# Patient Record
Sex: Male | Born: 1965
Health system: Southern US, Community
[De-identification: ages and names within clinical notes are randomized; demographics above are authoritative.]

## PROBLEM LIST (undated history)

## (undated) DIAGNOSIS — N289 Disorder of kidney and ureter, unspecified: Secondary | ICD-10-CM

## (undated) DIAGNOSIS — L309 Dermatitis, unspecified: Secondary | ICD-10-CM

## (undated) DIAGNOSIS — F79 Unspecified intellectual disabilities: Secondary | ICD-10-CM

## (undated) DIAGNOSIS — I499 Cardiac arrhythmia, unspecified: Secondary | ICD-10-CM

## (undated) DIAGNOSIS — R000 Tachycardia, unspecified: Secondary | ICD-10-CM

## (undated) DIAGNOSIS — I509 Heart failure, unspecified: Secondary | ICD-10-CM

## (undated) HISTORY — PX: NO PAST SURGERIES: SHX2092

## (undated) HISTORY — DX: Cardiac arrhythmia, unspecified: I49.9

## (undated) HISTORY — DX: Unspecified intellectual disabilities: F79

## (undated) HISTORY — DX: Heart failure, unspecified: I50.9

---

## 2010-07-04 ENCOUNTER — Ambulatory Visit: Payer: Self-pay | Admitting: Cardiovascular Disease

## 2011-05-08 ENCOUNTER — Other Ambulatory Visit: Payer: Self-pay | Admitting: *Deleted

## 2011-05-08 MED ORDER — METOPROLOL TARTRATE 25 MG PO TABS: 25.0000 mg | ORAL_TABLET | Freq: Every day | ORAL | Status: DC

## 2011-05-08 NOTE — Telephone Encounter (Signed)
strenghth verified, app made, med refilled

## 2011-06-22 ENCOUNTER — Encounter: Payer: Self-pay | Admitting: Cardiovascular Disease

## 2011-06-28 ENCOUNTER — Ambulatory Visit (INDEPENDENT_AMBULATORY_CARE_PROVIDER_SITE_OTHER): Payer: Medicaid Other | Admitting: Cardiovascular Disease

## 2011-06-28 ENCOUNTER — Encounter: Payer: Self-pay | Admitting: Cardiovascular Disease

## 2011-06-28 VITALS — BP 110/62 | HR 92 | Ht 66.0 in | Wt 121.0 lb

## 2011-06-28 DIAGNOSIS — I4891 Unspecified atrial fibrillation: Secondary | ICD-10-CM | POA: Insufficient documentation

## 2011-06-28 NOTE — Progress Notes (Signed)
Derrick Johns Date of Birth  Jul 14, 1966 [facility] [address]     [address] [place], [place]  [address] [phone]  Fax  [phone]  History of Present Illness:  This is a 45 year old gentleman with a history of mental retardation and chronic atrial fibrillation. I met him in 1997 when he presented to the hospital with congestive heart failure and a rapid heart rate. His son have rapid atrial fibrillation. His congestive heart failure has dramatically improved on medical therapy and rate control.  He has severe mental retardation. We've not had him on Coumadin because of his mental issues.  He presents today with no specific complaints. I see him on a yearly basis.  Current Outpatient Prescriptions on File Prior to Visit  Medication Sig Dispense Refill  . ASPIRIN PO Take by mouth daily.        . digoxin (LANOXIN) 0.125 MG tablet Take 125 mcg by mouth daily.        . metoprolol tartrate (LOPRESSOR) 25 MG tablet Take 1 tablet (25 mg total) by mouth daily.  30 tablet  5  . metoprolol (TOPROL-XL) 50 MG 24 hr tablet Take 50 mg by mouth daily.          No Known Allergies  Past Medical History  Diagnosis Date  . Arrhythmia     atrial fibrillation  . Congestive heart failure     - most likely related to A-Fib with a rapid ventrilar rate  . Mental retardation     No past surgical history on file.  History  Smoking status  . Unknown If Ever Smoked  Smokeless tobacco  . Not on file    History  Alcohol Use     No family history on file.  Reviw of Systems:  Reviewed in the HPI.  All other systems are negative.  Physical Exam: BP 110/62  Pulse 92  Ht 5\' 6"  (1.676 m)  Wt 121 lb (54.885 kg)  BMI 19.53 kg/m2 The patient is alert and oriented x 3.  The mood and affect are normal.   Skin: warm and dry.  Color is normal.    HEENT:   the sclera are nonicteric.  The mucous membranes are moist.  The carotids are 2+ without  bruits.  There is no thyromegaly.  There is no JVD.  All of his teeth have been pulled.  Lungs: clear.  The chest wall is non tender.    Heart: Irregular rate with a normal S1 and S2.  There are no murmurs, gallops, or rubs. The PMI is not displaced.     Abdomen: good bowel sounds.  There is no guarding or rebound.  There is no hepatosplenomegaly or tenderness.  There are no masses.   Extremities:  no clubbing, cyanosis, or edema.  The legs are without rashes.  The distal pulses are intact.   Neuro:  Cranial nerves II - XII are intact.  Motor and sensory functions are intact.    The gait is normal.  ECG: Atrial fibrillation with a controlled ventricular response  Assessment / Plan:

## 2011-06-28 NOTE — Assessment & Plan Note (Addendum)
Derrick Johns to be doing fairly well. His rate is fairly well controlled. We tried to minimize excessive medications. He seems to be fairly weight controlled on his current dose of metoprolol.  We've talked many times about anticoagulation. His left ventricular systolic function is still moderately depressed with an ejection fraction of 35-40%.  Not considered a Coumadin because of his severe mental retardation and general lack of understanding of medications. We talked a little bit about Pradaxa .  t this time I think is doing very well with aspirin.. I'll see him again in one year.  We'll continue to talk about these issues.

## 2011-06-29 ENCOUNTER — Encounter: Payer: Self-pay | Admitting: Cardiovascular Disease

## 2011-07-18 ENCOUNTER — Other Ambulatory Visit: Payer: Self-pay | Admitting: Cardiovascular Disease

## 2011-09-06 ENCOUNTER — Other Ambulatory Visit: Payer: Self-pay | Admitting: Cardiovascular Disease

## 2011-11-07 ENCOUNTER — Other Ambulatory Visit: Payer: Self-pay | Admitting: Cardiovascular Disease

## 2011-11-08 NOTE — Telephone Encounter (Signed)
Fax Received. Refill Completed. Kore Madlock Chowoe (R.M.A)   

## 2012-01-01 ENCOUNTER — Other Ambulatory Visit: Payer: Self-pay | Admitting: Cardiovascular Disease

## 2012-01-16 ENCOUNTER — Other Ambulatory Visit: Payer: Self-pay

## 2012-01-16 ENCOUNTER — Other Ambulatory Visit: Payer: Self-pay | Admitting: Cardiovascular Disease

## 2012-01-16 MED ORDER — DIGOXIN 125 MCG PO TABS: 125.0000 ug | ORAL_TABLET | Freq: Every day | ORAL | Status: DC

## 2012-02-23 ENCOUNTER — Telehealth: Payer: Self-pay | Admitting: Cardiovascular Disease

## 2012-02-23 ENCOUNTER — Other Ambulatory Visit: Payer: Self-pay

## 2012-02-23 ENCOUNTER — Other Ambulatory Visit: Payer: Self-pay | Admitting: *Deleted

## 2012-02-23 MED ORDER — DIGOXIN 125 MCG PO TABS: 125.0000 ug | ORAL_TABLET | Freq: Every day | ORAL | Status: DC

## 2012-02-23 NOTE — Telephone Encounter (Signed)
Digoxin refill sent to [facility] on [address]. Patient's family was called so that she can pick up her mother's medication.

## 2012-02-23 NOTE — Telephone Encounter (Signed)
Pt's mom requesting refill of digoxin asap only has enough until Saturday, pt taking 1 1/2 instead of 1 tab and needs a new rx [facility] [address]

## 2012-02-26 ENCOUNTER — Telehealth: Payer: Self-pay | Admitting: Cardiovascular Disease

## 2012-02-26 MED ORDER — DIGOXIN 125 MCG PO TABS: ORAL_TABLET | ORAL | Status: DC

## 2012-02-26 NOTE — Telephone Encounter (Signed)
Digoxin needs to be called in to [facility] [address], but mom said it was called in wrong last time at 1 tab every day instead if 1 1/2 tabs a day, pls change and needs refill asap ran out early

## 2012-02-26 NOTE — Telephone Encounter (Signed)
Called patient back, but no answer., medication has been refilled

## 2012-05-13 ENCOUNTER — Other Ambulatory Visit: Payer: Self-pay | Admitting: Cardiovascular Disease

## 2012-05-13 NOTE — Telephone Encounter (Signed)
Pt needs appointment then refill can be made Fax Received. Refill Completed. Derrick Johns (R.M.A)   

## 2012-05-22 ENCOUNTER — Telehealth: Payer: Self-pay | Admitting: Cardiovascular Disease

## 2012-05-22 MED ORDER — METOPROLOL TARTRATE 25 MG PO TABS: 25.0000 mg | ORAL_TABLET | Freq: Every day | ORAL | Status: DC

## 2012-05-22 NOTE — Telephone Encounter (Signed)
PT mom told he needs an app/ set/ refill completed

## 2012-05-22 NOTE — Telephone Encounter (Signed)
Please return call to patient mother Derrick Johns, [phone] regarding medication discussion.

## 2012-06-21 ENCOUNTER — Other Ambulatory Visit: Payer: Self-pay | Admitting: Cardiovascular Disease

## 2012-06-21 NOTE — Telephone Encounter (Signed)
Refilled digoxin one time, has appointment in Sept.

## 2012-07-25 ENCOUNTER — Other Ambulatory Visit: Payer: Self-pay | Admitting: Cardiovascular Disease

## 2012-07-25 MED ORDER — DIGOXIN 125 MCG PO TABS: ORAL_TABLET | ORAL | Status: DC

## 2012-07-26 ENCOUNTER — Ambulatory Visit: Payer: Medicare Other | Admitting: Cardiovascular Disease

## 2012-08-08 ENCOUNTER — Encounter: Payer: Self-pay | Admitting: Cardiovascular Disease

## 2012-08-08 ENCOUNTER — Ambulatory Visit (INDEPENDENT_AMBULATORY_CARE_PROVIDER_SITE_OTHER): Payer: Medicare Other | Admitting: Cardiovascular Disease

## 2012-08-08 VITALS — BP 134/84 | HR 77 | Ht 66.0 in | Wt 118.8 lb

## 2012-08-08 DIAGNOSIS — I4891 Unspecified atrial fibrillation: Secondary | ICD-10-CM

## 2012-08-08 NOTE — Patient Instructions (Addendum)
Your physician wants you to follow-up in: 1 year  You will receive a reminder letter in the mail two months in advance. If you don't receive a letter, please call our office to schedule the follow-up appointment.  Your physician recommends that you continue on your current medications as directed. Please refer to the Current Medication list given to you today.  

## 2012-08-09 NOTE — Assessment & Plan Note (Signed)
This is doing well. He remains in atrial fibrillation. He's very comfortable. He does not report any problems.  He's not a candidate for anticoagulation. He has severe mental retardation and probably would not able to tell us if he is having  any problems with abnormal bleeding.  We'll see him again in one year for followup visit.

## 2012-08-09 NOTE — Progress Notes (Signed)
    Derrick Johns Date of Birth  [birth year]       [facility]    [place] [address]  [address] [place], [place]  [address]   [place], [place]  [address] [phone]     [phone]   Fax  [phone]    Fax [phone]  Problem List: 1, Atrial fibrillation 2. Severe mental retardation  History of Present Illness:  This is a 46 year old gentleman who I have seen for many years. He has a history of chronic atrial fibrillation. He has a history of congestive heart failure which presumably was due to a rapid rate when I first met him. He's been well-controlled since I saw him. His rate has been well-controlled. He's been on digoxin.  He's not a candidate for anticoagulation. Both of his parents agree that he may not be able to tell us if he's having any GI bleeding.  Current Outpatient Prescriptions on File Prior to Visit  Medication Sig Dispense Refill  . ASPIRIN PO Take by mouth daily.        . digoxin (LANOXIN) 0.125 MG tablet Take 1 & 1/2 tablets by mouth once daily  25 tablet  0  . lisinopril (PRINIVIL,ZESTRIL) 10 MG tablet take 1 tablet by mouth once daily  30 tablet  PRN  . metoprolol tartrate (LOPRESSOR) 25 MG tablet Take 1 tablet (25 mg total) by mouth daily.  30 tablet  1    No Known Allergies  Past Medical History  Diagnosis Date  . Arrhythmia     atrial fibrillation  . Congestive heart failure     - most likely related to A-Fib with a rapid ventrilar rate  . Mental retardation     No past surgical history on file.  History  Smoking status  . Unknown If Ever Smoked  Smokeless tobacco  . Not on file    History  Alcohol Use     No family history on file.  Reviw of Systems:  Reviewed in the HPI.  All other systems are negative.  Physical Exam: Blood pressure 134/84, pulse 77, height 5\' 6"  (1.676 m), weight 118 lb 12.8 oz (53.887 kg). General: thin  Head: Normocephalic, atraumatic, sclera  non-icteric, mucus membranes are moist,   Neck: Supple. Carotids are 2 + without bruits. No JVD  Lungs: Clear bilaterally to auscultation.  Heart: irregularly irregular.  normal  S1 S2. No murmurs, gallops or rubs.  Abdomen: Soft, non-tender, non-distended with normal bowel sounds. No hepatomegaly. No rebound/guarding. No masses.  Msk:  Strength and tone are normal  Extremities: No clubbing or cyanosis. No edema.  Distal pedal pulses are 2+ and equal bilaterally.  Neuro: Alert and oriented X 3. Moves all extremities spontaneously.  Psych:  Responds to questions appropriately with a normal affect.  ECG: Atrial fibrillation with a heart rate of 77. He has nonspecific ST and T wave changes. Has right axis deviation.  Assessment / Plan:

## 2012-08-12 ENCOUNTER — Other Ambulatory Visit: Payer: Self-pay | Admitting: *Deleted

## 2012-08-12 ENCOUNTER — Other Ambulatory Visit: Payer: Self-pay | Admitting: Cardiovascular Disease

## 2012-08-12 MED ORDER — LISINOPRIL 10 MG PO TABS: 10.0000 mg | ORAL_TABLET | Freq: Every day | ORAL | Status: DC

## 2012-08-12 MED ORDER — DIGOXIN 125 MCG PO TABS: ORAL_TABLET | ORAL | Status: DC

## 2012-08-12 NOTE — Telephone Encounter (Signed)
Fax Received. Refill Completed. Derrick Johns (R.M.A)   

## 2012-08-12 NOTE — Telephone Encounter (Signed)
Refilled Lisinopril. 

## 2012-08-12 NOTE — Telephone Encounter (Signed)
rx sent into pharmacy. Pt mother aware.

## 2012-08-12 NOTE — Telephone Encounter (Signed)
Follow-up:    Patient's mother called in needing a refill of the patient's digoxin (LANOXIN) 0.125 MG tablet tonight.

## 2012-08-16 ENCOUNTER — Encounter: Payer: Self-pay | Admitting: Cardiovascular Disease

## 2012-09-06 ENCOUNTER — Other Ambulatory Visit: Payer: Self-pay | Admitting: Cardiovascular Disease

## 2012-11-13 ENCOUNTER — Telehealth: Payer: Self-pay | Admitting: Cardiovascular Disease

## 2012-11-13 MED ORDER — METOPROLOL TARTRATE 25 MG PO TABS: 25.0000 mg | ORAL_TABLET | Freq: Every day | ORAL | Status: DC

## 2012-11-13 NOTE — Telephone Encounter (Signed)
Fax Received. Refill Completed. Abbee Cremeens Chowoe (R.M.A)   

## 2012-11-13 NOTE — Telephone Encounter (Signed)
Metoprolol 25mg  refill needed [facility] [address] ,  pt out needs called in asap

## 2012-11-14 ENCOUNTER — Other Ambulatory Visit: Payer: Self-pay | Admitting: *Deleted

## 2012-11-14 NOTE — Telephone Encounter (Signed)
Opened in Error.

## 2013-03-11 ENCOUNTER — Other Ambulatory Visit: Payer: Self-pay | Admitting: *Deleted

## 2013-03-11 MED ORDER — LISINOPRIL 10 MG PO TABS: 10.0000 mg | ORAL_TABLET | Freq: Every day | ORAL | Status: DC

## 2013-03-11 NOTE — Telephone Encounter (Signed)
Fax received from pharmacy. Refill completed. Jodette Kashari Chalmers RN  

## 2013-05-14 ENCOUNTER — Other Ambulatory Visit: Payer: Self-pay | Admitting: *Deleted

## 2013-05-14 MED ORDER — METOPROLOL TARTRATE 25 MG PO TABS: 25.0000 mg | ORAL_TABLET | Freq: Every day | ORAL | Status: DC

## 2013-05-14 NOTE — Telephone Encounter (Signed)
Fax Received. Refill Completed. Preeti Winegardner Chowoe (R.M.A)  NEED APPOINTMENT 

## 2013-08-27 ENCOUNTER — Encounter: Payer: Self-pay | Admitting: Cardiovascular Disease

## 2013-08-27 ENCOUNTER — Other Ambulatory Visit: Payer: Self-pay | Admitting: Cardiovascular Disease

## 2013-09-02 ENCOUNTER — Ambulatory Visit (INDEPENDENT_AMBULATORY_CARE_PROVIDER_SITE_OTHER): Payer: Medicare Other | Admitting: Cardiovascular Disease

## 2013-09-02 ENCOUNTER — Encounter: Payer: Self-pay | Admitting: Cardiovascular Disease

## 2013-09-02 VITALS — BP 120/72 | HR 77 | Ht 66.0 in | Wt 112.0 lb

## 2013-09-02 DIAGNOSIS — I4891 Unspecified atrial fibrillation: Secondary | ICD-10-CM

## 2013-09-02 NOTE — Progress Notes (Signed)
     Derrick Johns Date of Birth  1966-06-03       [facility]    [place] [address]  [address] [place], [place]  [address]   [place], [place]  [address] [phone]     [phone]   Fax  [phone]    Fax [phone]  Problem List: 1, Atrial fibrillation 2. Severe mental retardation  History of Present Illness:  This is a 47 year old gentleman who I have seen for many years. He has a history of chronic atrial fibrillation. He has a history of congestive heart failure which presumably was due to a rapid rate when I first met him. He's been well-controlled since I saw him. His rate has been well-controlled. He's been on digoxin.  He's not a candidate for anticoagulation. Both of his parents agree that he may not be able to tell us if he's having any GI bleeding.  Oct. 28, 2014:  Derrick Johns is doing great. No complaints.    Current Outpatient Prescriptions on File Prior to Visit  Medication Sig Dispense Refill  . digoxin (LANOXIN) 0.125 MG tablet Take 1 & 1/2 tablets by mouth once daily  45 tablet  0  . lisinopril (PRINIVIL,ZESTRIL) 10 MG tablet Take 1 tablet (10 mg total) by mouth daily.  90 tablet  3  . metoprolol tartrate (LOPRESSOR) 25 MG tablet Take 1 tablet (25 mg total) by mouth daily.  30 tablet  3   No current facility-administered medications on file prior to visit.    No Known Allergies  Past Medical History  Diagnosis Date  . Arrhythmia     atrial fibrillation  . Congestive heart failure     - most likely related to A-Fib with a rapid ventrilar rate  . Mental retardation     No past surgical history on file.  History  Smoking status  . Never Smoker   Smokeless tobacco  . Not on file    History  Alcohol Use: Not on file    No family history on file.  Reviw of Systems:  Reviewed in the HPI.  All other systems are negative.  Physical Exam: Blood pressure 120/72, pulse 77, height 5\' 6"  (1.676  m), weight 112 lb (50.803 kg). General: thin  Head: Normocephalic, atraumatic, sclera non-icteric, mucus membranes are moist,   Neck: Supple. Carotids are 2 + without bruits. No JVD  Lungs: Clear bilaterally to auscultation.  Heart: irregularly irregular.  normal  S1 S2. No murmurs, gallops or rubs.  Abdomen: Soft, non-tender, non-distended with normal bowel sounds. No hepatomegaly. No rebound/guarding. No masses.  Msk:  Strength and tone are normal  Extremities: No clubbing or cyanosis. No edema.  Distal pedal pulses are 2+ and equal bilaterally.  Neuro: Alert and oriented X 3. Moves all extremities spontaneously.  Psych:  Responds to questions appropriately with a normal affect.  ECG: Oct. 28, 2014: Atrial fibrillation with a heart rate of 77. He has nonspecific ST and T wave changes. Has right axis deviation.  Assessment / Plan:

## 2013-09-02 NOTE — Assessment & Plan Note (Signed)
Derrick Johns is doing well.  He has no complaints.  Will continue current meds. I will see him in 1 year

## 2013-09-02 NOTE — Patient Instructions (Signed)
Your physician recommends that you continue on your current medications as directed. Please refer to the Current Medication list given to you today.  Your physician wants you to follow-up in: 1 year with Dr. Nasher. You will receive a reminder letter in the mail two months in advance. If you don't receive a letter, please call our office to schedule the follow-up appointment.  

## 2013-09-08 ENCOUNTER — Other Ambulatory Visit: Payer: Self-pay | Admitting: *Deleted

## 2013-09-08 MED ORDER — DIGOXIN 125 MCG PO TABS: ORAL_TABLET | ORAL | Status: DC

## 2013-10-06 ENCOUNTER — Other Ambulatory Visit: Payer: Self-pay | Admitting: *Deleted

## 2013-10-06 MED ORDER — METOPROLOL TARTRATE 25 MG PO TABS: 25.0000 mg | ORAL_TABLET | Freq: Every day | ORAL | Status: DC

## 2014-03-13 ENCOUNTER — Other Ambulatory Visit: Payer: Self-pay

## 2014-03-13 MED ORDER — LISINOPRIL 10 MG PO TABS: 10.0000 mg | ORAL_TABLET | Freq: Every day | ORAL | Status: DC

## 2014-06-11 ENCOUNTER — Other Ambulatory Visit: Payer: Self-pay

## 2014-06-11 MED ORDER — LISINOPRIL 10 MG PO TABS: 10.0000 mg | ORAL_TABLET | Freq: Every day | ORAL | Status: DC

## 2014-08-05 ENCOUNTER — Other Ambulatory Visit: Payer: Self-pay | Admitting: *Deleted

## 2014-08-05 MED ORDER — DIGOXIN 125 MCG PO TABS: ORAL_TABLET | ORAL | Status: DC

## 2014-08-25 ENCOUNTER — Other Ambulatory Visit: Payer: Self-pay | Admitting: *Deleted

## 2014-08-25 MED ORDER — METOPROLOL TARTRATE 25 MG PO TABS: 25.0000 mg | ORAL_TABLET | Freq: Every day | ORAL | Status: DC

## 2014-09-06 ENCOUNTER — Other Ambulatory Visit: Payer: Self-pay

## 2014-09-06 MED ORDER — DIGOXIN 125 MCG PO TABS: ORAL_TABLET | ORAL | Status: DC

## 2014-09-10 ENCOUNTER — Other Ambulatory Visit: Payer: Self-pay | Admitting: Cardiovascular Disease

## 2014-09-10 MED ORDER — LISINOPRIL 10 MG PO TABS: 10.0000 mg | ORAL_TABLET | Freq: Every day | ORAL | Status: DC

## 2014-10-06 ENCOUNTER — Other Ambulatory Visit: Payer: Self-pay | Admitting: *Deleted

## 2014-10-06 MED ORDER — DIGOXIN 125 MCG PO TABS: ORAL_TABLET | ORAL | Status: DC

## 2014-10-08 ENCOUNTER — Encounter: Payer: Self-pay | Admitting: Cardiovascular Disease

## 2014-10-08 ENCOUNTER — Ambulatory Visit (INDEPENDENT_AMBULATORY_CARE_PROVIDER_SITE_OTHER): Payer: Medicare Other | Admitting: Cardiovascular Disease

## 2014-10-08 VITALS — BP 110/76 | HR 87 | Ht 66.0 in | Wt 121.0 lb

## 2014-10-08 DIAGNOSIS — I482 Chronic atrial fibrillation, unspecified: Secondary | ICD-10-CM

## 2014-10-08 NOTE — Progress Notes (Signed)
     Derrick Johns Date of Birth  04/11/1966       [place] [facility]    [place] [facility] [address]  [address] [place], [place]  [address]   , [place]  [address] [phone]     [phone]   Fax  [phone]    Fax [phone]  Problem List: 1, Atrial fibrillation 2. Severe mental retardation  History of Present Illness:  This is a 48-year-old gentleman who I have seen for many years. He has a history of chronic atrial fibrillation. He has a history of congestive heart failure which presumably was due to a rapid rate when I first met him. He's been well-controlled since I saw him. His rate has been well-controlled. He's been on digoxin.  He's not a candidate for anticoagulation. Both of his parents agree that he may not be able to tell us if he's having any GI bleeding.  Oct. 28, 2014:  Derrick Johns is doing great. No complaints.   Dec. 3, 2015:  Derrick Johns is doing well .  No complaints. Was seen with his mother.    Current Outpatient Prescriptions on File Prior to Visit  Medication Sig Dispense Refill  . aspirin 81 MG tablet Take 325 mg by mouth daily.     . digoxin (LANOXIN) 0.125 MG tablet Take 1 & 1/2 tablets by mouth once daily 45 tablet 0  . lisinopril (PRINIVIL,ZESTRIL) 10 MG tablet Take 1 tablet (10 mg total) by mouth daily. 90 tablet 0  . metoprolol tartrate (LOPRESSOR) 25 MG tablet Take 1 tablet (25 mg total) by mouth daily. 30 tablet 5   No current facility-administered medications on file prior to visit.    No Known Allergies  Past Medical History  Diagnosis Date  . Arrhythmia     atrial fibrillation  . Congestive heart failure     - most likely related to A-Fib with a rapid ventrilar rate  . Mental retardation     No past surgical history on file.  History  Smoking status  . Never Smoker   Smokeless tobacco  . Not on file    History  Alcohol Use: Not on file    No family history on file.  Reviw of  Systems:  Reviewed in the HPI.  All other systems are negative.  Physical Exam: Blood pressure 110/76, pulse 87, height 5' 6" (1.676 m), weight 121 lb (54.885 kg). General: thin  Head: Normocephalic, atraumatic, sclera non-icteric, mucus membranes are moist,   Neck: Supple. Carotids are 2 + without bruits. No JVD  Lungs: Clear bilaterally to auscultation.  Heart: irregularly irregular.  normal  S1 S2. No murmurs, gallops or rubs.  Abdomen: Soft, non-tender, non-distended with normal bowel sounds. No hepatomegaly. No rebound/guarding. No masses.  Msk:  Strength and tone are normal  Extremities: No clubbing or cyanosis. No edema.  Distal pedal pulses are 2+ and equal bilaterally.  Neuro: Alert and oriented X 3. Moves all extremities spontaneously.  Psych:  Responds to questions appropriately with a normal affect.  ECG: Dec. 3, 2015:   Atrial fibrillation with a heart rate of 87. LBBB.   He has nonspecific ST and T wave changes. Has right axis deviation.  Assessment / Plan:   

## 2014-10-08 NOTE — Patient Instructions (Signed)
Your physician recommends that you continue on your current medications as directed. Please refer to the Current Medication list given to you today.  Your physician wants you to follow-up in: 1 year with Dr. Nahser.  You will receive a reminder letter in the mail two months in advance. If you don't receive a letter, please call our office to schedule the follow-up appointment.  

## 2014-10-08 NOTE — Assessment & Plan Note (Signed)
Derrick Johns continues to be relatively asymptomatic. He's had chronic atrial fibrillation. And severe mental retardation and so we have not pursued anticoagulation. He's not having any symptoms of shortness of breath.  We'll continue with the same medications. His heart rate seems to be well-controlled. I'll see him again in one year.

## 2014-11-04 ENCOUNTER — Other Ambulatory Visit: Payer: Self-pay | Admitting: *Deleted

## 2014-11-04 MED ORDER — DIGOXIN 125 MCG PO TABS: ORAL_TABLET | ORAL | Status: DC

## 2014-12-03 ENCOUNTER — Other Ambulatory Visit: Payer: Self-pay | Admitting: *Deleted

## 2014-12-03 MED ORDER — LISINOPRIL 10 MG PO TABS: 10.0000 mg | ORAL_TABLET | Freq: Every day | ORAL | Status: DC

## 2014-12-18 ENCOUNTER — Telehealth: Payer: Self-pay | Admitting: Nurse Practitioner

## 2014-12-18 NOTE — Telephone Encounter (Signed)
  He has chronic atrial fib for at least 20+ years. He has severe mental retardation and cannot take anticoagulants because of inability to adequately monitor This patient has been stable on this dose for years.   Digoxin 0.25 mg is too high of a dose and Digoxin 0.125 a day does not control his HR adequately. Through years of monitoring , trial and error, we have established that this is the best way to control his HR.

## 2014-12-18 NOTE — Telephone Encounter (Signed)
Received documentation on patient from [employer] that quantity of Digoxin 0.125 mg cannot be filled for more than 30 pills per 30 days.  Patient has been prescribed 1 1/2 tabs daily.  Routing to Dr. Elease HashimotoNahser for documentation for insurance company that patient needs to remain on Digoxin 0.1875 mg daily.

## 2014-12-18 NOTE — Telephone Encounter (Signed)
Paperwork placed in HIM for faxing

## 2014-12-24 ENCOUNTER — Telehealth: Payer: Self-pay | Admitting: Cardiovascular Disease

## 2014-12-24 NOTE — Telephone Encounter (Signed)
New Msg       Pt c/o medication issue:  1. Name of Medication: Digoxin  2. How are you currently taking this medication (dosage and times per day)? 1 1/2 pills daily  3. Are you having a reaction (difficulty breathing--STAT)? no  4. What is your medication issue? Pt insurance is only going to approve 30 day supplies moving forward.   Pt only has 16 pills left.   Please call pt mother Claris CheMargaret about concern.

## 2014-12-24 NOTE — Telephone Encounter (Signed)
I spoke with [employer] [phone]) after faxing paperwork x 2 and then spending 50 minutes on the telephone.  I got approval for patient to receive 45 pills every 30 days, good through 12/24/15 Approval # [id number]  I spoke with patient's mother and advised her of approval.  I advised her to call back if she has any problems or concerns; she verbalized understanding and gratitude.

## 2015-01-28 ENCOUNTER — Emergency Department (HOSPITAL_COMMUNITY)
Admission: EM | Admit: 2015-01-28 | Discharge: 2015-01-28 | Disposition: A | Discharge status: Home / Self Care | Payer: Medicare Other | Attending: Emergency Medicine | Admitting: Emergency Medicine

## 2015-01-28 ENCOUNTER — Emergency Department (HOSPITAL_COMMUNITY): Admission: EM | Admit: 2015-01-28 | Discharge: 2015-01-28 | Discharge status: Absent without leave | Payer: Self-pay

## 2015-01-28 ENCOUNTER — Encounter (HOSPITAL_COMMUNITY): Payer: Self-pay | Admitting: Emergency Medicine

## 2015-01-28 DIAGNOSIS — K5900 Constipation, unspecified: Secondary | ICD-10-CM | POA: Diagnosis not present

## 2015-01-28 DIAGNOSIS — I4891 Unspecified atrial fibrillation: Secondary | ICD-10-CM | POA: Diagnosis not present

## 2015-01-28 DIAGNOSIS — Z87448 Personal history of other diseases of urinary system: Secondary | ICD-10-CM | POA: Diagnosis not present

## 2015-01-28 DIAGNOSIS — Z7982 Long term (current) use of aspirin: Secondary | ICD-10-CM | POA: Insufficient documentation

## 2015-01-28 DIAGNOSIS — I509 Heart failure, unspecified: Secondary | ICD-10-CM | POA: Diagnosis not present

## 2015-01-28 DIAGNOSIS — Z872 Personal history of diseases of the skin and subcutaneous tissue: Secondary | ICD-10-CM | POA: Insufficient documentation

## 2015-01-28 DIAGNOSIS — Z79899 Other long term (current) drug therapy: Secondary | ICD-10-CM | POA: Insufficient documentation

## 2015-01-28 DIAGNOSIS — F79 Unspecified intellectual disabilities: Secondary | ICD-10-CM | POA: Diagnosis not present

## 2015-01-28 HISTORY — DX: Tachycardia, unspecified: R00.0

## 2015-01-28 HISTORY — DX: Dermatitis, unspecified: L30.9

## 2015-01-28 HISTORY — DX: Disorder of kidney and ureter, unspecified: N28.9

## 2015-01-28 MED ORDER — POLYETHYLENE GLYCOL 3350 17 GM/SCOOP PO POWD
1.0000 | Freq: Once | ORAL | Status: DC
  Filled 2015-01-28: qty 255

## 2015-01-28 MED ORDER — BISACODYL 5 MG PO TBEC: 5.0000 mg | DELAYED_RELEASE_TABLET | Freq: Two times a day (BID) | ORAL | Status: DC

## 2015-01-28 NOTE — ED Provider Notes (Signed)
CSN: [id number]     Arrival date & time 01/28/15  1448 History   First MD Initiated Contact with Patient 01/28/15 1620     Chief Complaint  Patient presents with  . Constipation   Level V caveat: MR  (Consider location/radiation/quality/duration/timing/severity/associated sxs/prior Treatment) HPI Derrick Johns is a 49 y.o. male with a history of MR, one kidney (congenital) comes in for evaluation of constipation. He is accompanied by his mother who contributes to the history of present illness. Mom states that patient typically is constipated and will only have a bowel movement once per week on Saturday mornings, but has not had a bowel movement the past 2 Saturdays. Mom has tried magnesium citrate and patient passed "some liquid stool, but nothing formed". They were seen at an urgent care facility and prescribed Linzess. Patient has taken 3 tablets of the Linzess and again passed some liquid stool, but nothing formed. Mom denies any fevers at home, signs of abdominal discomfort. She states patient is eating well, drinking sodas and green tea, denies any nausea or vomiting. No other aggravating or modifying factors.  Past Medical History  Diagnosis Date  . Arrhythmia     atrial fibrillation  . Congestive heart failure     - most likely related to A-Fib with a rapid ventrilar rate  . Mental retardation   . Eczema   . Tachycardia   . Renal disorder     born with only one kidney.   History reviewed. No pertinent past surgical history. History reviewed. No pertinent family history. History  Substance Use Topics  . Smoking status: Never Smoker   . Smokeless tobacco: Not on file  . Alcohol Use: No    Review of Systems  Unable to perform ROS     Allergies  Review of patient's allergies indicates no known allergies.  Home Medications   Prior to Admission medications   Medication Sig Start Date End Date Taking? Authorizing Provider  digoxin (LANOXIN) 0.125 MG tablet Take 187.5  mcg by mouth daily. 12/31/14  Yes Historical Provider, MD  Linaclotide (LINZESS PO) Take 1 tablet by mouth daily.   Yes Historical Provider, MD  lisinopril (PRINIVIL,ZESTRIL) 10 MG tablet Take 10 mg by mouth daily. 12/03/14  Yes Historical Provider, MD  magnesium citrate SOLN Take 1 Bottle by mouth as needed for mild constipation, moderate constipation or severe constipation.   Yes Historical Provider, MD  metoprolol tartrate (LOPRESSOR) 25 MG tablet Take 1 tablet by mouth daily. 01/23/15  Yes Historical Provider, MD  aspirin 81 MG tablet Take 325 mg by mouth daily.     Historical Provider, MD  bisacodyl (DULCOLAX) 5 MG EC tablet Take 1 tablet (5 mg total) by mouth 2 (two) times daily. 01/28/15   Joycie Peek, PA-C  digoxin (LANOXIN) 0.125 MG tablet Take 1 & 1/2 tablets by mouth once daily 11/04/14   Vesta Mixer, MD  lisinopril (PRINIVIL,ZESTRIL) 10 MG tablet Take 1 tablet (10 mg total) by mouth daily. 12/03/14   Vesta Mixer, MD  metoprolol tartrate (LOPRESSOR) 25 MG tablet Take 1 tablet (25 mg total) by mouth daily. 08/25/14   Vesta Mixer, MD   BP 127/94 mmHg  Pulse 50  Temp(Src) 97.5 F (36.4 C) (Axillary)  Resp 18  SpO2 98% Physical Exam  Constitutional: He is oriented to person, place, and time. He appears well-developed and well-nourished. No distress.  HENT:  Head: Normocephalic and atraumatic.  Mouth/Throat: Oropharynx is clear and moist.  Eyes: Conjunctivae are  normal. Pupils are equal, round, and reactive to light. Right eye exhibits no discharge. Left eye exhibits no discharge. No scleral icterus.  Neck: Neck supple.  Cardiovascular: Normal rate, regular rhythm and normal heart sounds.   Pulmonary/Chest: Effort normal and breath sounds normal. No respiratory distress. He has no wheezes. He has no rales.  Abdominal: Soft. There is no tenderness.  Abdomen is firm with very mild distention. Pt does not grimace, withdraw or show any signs of discomfort with palpation of  abdomen. No focal tenderness appreciated. No other lesions, deformities or focal masses noted. No rebound or guarding.  Musculoskeletal: He exhibits no tenderness.  Neurological: He is alert and oriented to person, place, and time.  Cranial Nerves II-XII grossly intact  Skin: Skin is warm and dry. No rash noted. He is not diaphoretic.  Psychiatric: He has a normal mood and affect.  Nursing note and vitals reviewed.   ED Course  Procedures (including critical care time) Labs Review Labs Reviewed - No data to display  Imaging Review No results found.   EKG Interpretation None     Meds given in ED:  Medications - No data to display  Discharge Medication List as of 01/28/2015  5:44 PM    START taking these medications   Details  bisacodyl (DULCOLAX) 5 MG EC tablet Take 1 tablet (5 mg total) by mouth 2 (two) times daily., Starting 01/28/2015, Until Discontinued, Print       Filed Vitals:   01/28/15 1528  BP: 127/94  Pulse: 50  Temp: 97.5 F (36.4 C)  TempSrc: Axillary  Resp: 18  SpO2: 98%    MDM  Vitals stable, afebrile Patient with MR here for evaluation of constipation Tolerating PO in ED Abdominal exam not concerning for acute or emergent pathology, consistent with large stool burden. No Organomegaly.   Pt here with acute exacerbation of chronic constipation. No evidence of Acute Appendicitis, Cholecystitis, Pancreatitis, Pyelonephritis, Cystitis, Diverticulitis, Acute infectious Colitis, SBO, Perforation. No evidence of GI bleed. Doubt torsion. Low suspicion for ischemia or other vascular compromise. Doubt Cardiovascular/Pulmonary etiology  I have personally reviewed all labs, imaging, nursing/prvious notes during the patient's evaluation in the ED today. No evidence of other acute or emergent pathology that requires immediate intervention at this time. Pt stable, in good condition and is appropriate for discharge. DC with osmotic laxative as well as short course  stimulant laxative and instructions to follow up with PCP within 48 hrs for further evaluation and management of symptoms. Also discussed restricting soda intake, increase water intake to assist with facilitation of BM. Discussed return precautions, fevers, emesis, abdominal pain, decreased appetite or food intake, etc. Pt and mother agree with plan.  Prior to patient discharge, I discussed and reviewed this case with Dr.Lockwood    Final diagnoses:  Constipation, unspecified constipation type      Joycie PeekBenjamin Paiton Boultinghouse, PA-C 01/29/15 1710  Gerhard Munchobert Lockwood, MD 01/31/15 2351

## 2015-01-28 NOTE — ED Notes (Signed)
PA at bedside.

## 2015-01-28 NOTE — Discharge Instructions (Signed)
Constipation °Constipation is when a person has fewer than three bowel movements a week, has difficulty having a bowel movement, or has stools that are dry, hard, or larger than normal. As people grow older, constipation is more common. If you try to fix constipation with medicines that make you have a bowel movement (laxatives), the problem may get worse. Long-term laxative use may cause the muscles of the colon to become weak. A low-fiber diet, not taking in enough fluids, and taking certain medicines may make constipation worse.  °CAUSES  °· Certain medicines, such as antidepressants, pain medicine, iron supplements, antacids, and water pills.   °· Certain diseases, such as diabetes, irritable bowel syndrome (IBS), thyroid disease, or depression.   °· Not drinking enough water.   °· Not eating enough fiber-rich foods.   °· Stress or travel.   °· Lack of physical activity or exercise.   °· Ignoring the urge to have a bowel movement.   °· Using laxatives too much.   °SIGNS AND SYMPTOMS  °· Having fewer than three bowel movements a week.   °· Straining to have a bowel movement.   °· Having stools that are hard, dry, or larger than normal.   °· Feeling full or bloated.   °· Pain in the lower abdomen.   °· Not feeling relief after having a bowel movement.   °DIAGNOSIS  °Your health care provider will take a medical history and perform a physical exam. Further testing may be done for severe constipation. Some tests may include: °· A barium enema X-ray to examine your rectum, colon, and, sometimes, your small intestine.   °· A sigmoidoscopy to examine your lower colon.   °· A colonoscopy to examine your entire colon. °TREATMENT  °Treatment will depend on the severity of your constipation and what is causing it. Some dietary treatments include drinking more fluids and eating more fiber-rich foods. Lifestyle treatments may include regular exercise. If these diet and lifestyle recommendations do not help, your health care  provider may recommend taking over-the-counter laxative medicines to help you have bowel movements. Prescription medicines may be prescribed if over-the-counter medicines do not work.  °HOME CARE INSTRUCTIONS  °· Eat foods that have a lot of fiber, such as fruits, vegetables, whole grains, and beans. °· Limit foods high in fat and processed sugars, such as french fries, hamburgers, cookies, candies, and soda.   °· A fiber supplement may be added to your diet if you cannot get enough fiber from foods.   °· Drink enough fluids to keep your urine clear or pale yellow.   °· Exercise regularly or as directed by your health care provider.   °· Go to the restroom when you have the urge to go. Do not hold it.   °· Only take over-the-counter or prescription medicines as directed by your health care provider. Do not take other medicines for constipation without talking to your health care provider first.   °SEEK IMMEDIATE MEDICAL CARE IF:  °· You have bright red blood in your stool.   °· Your constipation lasts for more than 4 days or gets worse.   °· You have abdominal or rectal pain.   °· You have thin, pencil-like stools.   °· You have unexplained weight loss. °MAKE SURE YOU:  °· Understand these instructions. °· Will watch your condition. °· Will get help right away if you are not doing well or get worse. °Document Released: 07/21/2004 Document Revised: 10/28/2013 Document Reviewed: 08/04/2013 °[employer]® Patient Information ©2015 [employer]. This information is not intended to replace advice given to you by your health care provider. Make sure you discuss any questions   you have with your health care provider.  You were evaluated in the ED today for your constipation. There does not appear to be an emergent cause for your symptoms at this time. Please take your medications as directed. Please follow-up with her primary care for further evaluation and management of your symptoms. Return to ED for new or worsening  symptoms, fevers, chills, nausea or vomiting, decreased appetite, abdominal pain.

## 2015-01-28 NOTE — ED Notes (Signed)
Pt c/o constipation x 3 weeks, tried citrate of magnesia without success. Pt states constipation is a chronic problem but hasn't had BM in 3 weeks. Denies pain.

## 2015-01-28 NOTE — ED Notes (Signed)
Pt very anxious, pacing the hallway bc he is ready to leave.

## 2015-01-29 ENCOUNTER — Encounter: Payer: Self-pay | Admitting: Cardiovascular Disease

## 2015-02-04 ENCOUNTER — Emergency Department (HOSPITAL_COMMUNITY): Payer: Medicare Other

## 2015-02-04 ENCOUNTER — Encounter (HOSPITAL_COMMUNITY): Payer: Self-pay | Admitting: *Deleted

## 2015-02-04 ENCOUNTER — Emergency Department (HOSPITAL_COMMUNITY)
Admission: EM | Admit: 2015-02-04 | Discharge: 2015-02-04 | Disposition: A | Discharge status: Home / Self Care | Payer: Medicare Other | Attending: Emergency Medicine | Admitting: Emergency Medicine

## 2015-02-04 DIAGNOSIS — Z79899 Other long term (current) drug therapy: Secondary | ICD-10-CM | POA: Diagnosis not present

## 2015-02-04 DIAGNOSIS — Z87448 Personal history of other diseases of urinary system: Secondary | ICD-10-CM | POA: Insufficient documentation

## 2015-02-04 DIAGNOSIS — Z7982 Long term (current) use of aspirin: Secondary | ICD-10-CM | POA: Diagnosis not present

## 2015-02-04 DIAGNOSIS — K5900 Constipation, unspecified: Secondary | ICD-10-CM | POA: Insufficient documentation

## 2015-02-04 DIAGNOSIS — F99 Mental disorder, not otherwise specified: Secondary | ICD-10-CM | POA: Diagnosis not present

## 2015-02-04 DIAGNOSIS — Z872 Personal history of diseases of the skin and subcutaneous tissue: Secondary | ICD-10-CM | POA: Insufficient documentation

## 2015-02-04 DIAGNOSIS — I509 Heart failure, unspecified: Secondary | ICD-10-CM | POA: Diagnosis not present

## 2015-02-04 MED ORDER — POLYETHYLENE GLYCOL 3350 17 G PO PACK
17.0000 g | PACK | Freq: Every day | ORAL | Status: AC
Start: 2015-02-04 — End: ?

## 2015-02-04 NOTE — ED Provider Notes (Signed)
CSN: [id number]     Arrival date & time 02/04/15  1419 History   First MD Initiated Contact with Patient 02/04/15 1508     Chief Complaint  Patient presents with  . Constipation     (Consider location/radiation/quality/duration/timing/severity/associated sxs/prior Treatment) HPI Comments: Derrick Johns is a 49 y.o. male with a PMHx of mental retardation, congenital solitary kidney, and paroxsymal afib, who presents to the ED with his mother who gives most of the history, with complaints of constipation. LEVEL 5 CAVEAT: MENTAL RETARDATION. The patient's mother reports that he typically has 1 formed bowel movement per week, but that he has only had 1 bowel movement in the last 4 weeks which was last Thursday after he was given PEG solution subsequent to an ER visit. The bowel movement that day was "normal appearing" and soft. Patient's mother reports that he has been taking 2 tablets of Dulcolax daily since Saturday with no bowel movements. She reports that he has been eating and drinking well and his urine output has been the same. The patient denies any abdominal pain, nausea, dysuria, chest pain, or shortness of breath. The mother denies any fevers/chills, vomiting, melena, hematochezia, or hematuria.  Patient is a 49 y.o. male presenting with constipation. The history is provided by a parent. The history is limited by a developmental delay. No language interpreter was used.  Constipation Severity:  Moderate Time since last bowel movement:  1 week Timing:  Constant Progression:  Unchanged Chronicity:  Chronic Context: not narcotics   Stool description:  Formed Unusual stool frequency:  1x/week Relieved by:  Nothing Worsened by:  Nothing tried Ineffective treatments:  Laxatives and stool softeners Associated symptoms: no abdominal pain, no diarrhea, no dysuria, no fever, no flatus, no hematochezia, no nausea, no urinary retention and no vomiting     Past Medical History  Diagnosis  Date  . Arrhythmia     atrial fibrillation  . Congestive heart failure     - most likely related to A-Fib with a rapid ventrilar rate  . Mental retardation   . Eczema   . Tachycardia   . Renal disorder     born with only one kidney.   History reviewed. No pertinent past surgical history. History reviewed. No pertinent family history. History  Substance Use Topics  . Smoking status: Never Smoker   . Smokeless tobacco: Not on file  . Alcohol Use: No     LEVEL 5 CAVEAT: MENTAL RETARDATION Review of Systems  Unable to perform ROS: Other  Constitutional: Negative for fever and chills.  HENT: Negative for sore throat.   Respiratory: Negative for shortness of breath.   Cardiovascular: Negative for chest pain.  Gastrointestinal: Positive for constipation. Negative for nausea, vomiting, abdominal pain, diarrhea, blood in stool, hematochezia, anal bleeding and flatus.  Genitourinary: Negative for dysuria and hematuria.  Skin: Negative for rash.  Psychiatric/Behavioral: Negative for behavioral problems.      Allergies  Review of patient's allergies indicates no known allergies.  Home Medications   Prior to Admission medications   Medication Sig Start Date End Date Taking? Authorizing Provider  aspirin 325 MG tablet Take 325 mg by mouth daily.   Yes Historical Provider, MD  bisacodyl (DULCOLAX) 5 MG EC tablet Take 1 tablet (5 mg total) by mouth 2 (two) times daily. 01/28/15  Yes Joycie Peek, PA-C  digoxin (LANOXIN) 0.125 MG tablet Take 1 & 1/2 tablets by mouth once daily 11/04/14  Yes Vesta Mixer, MD  digoxin (LANOXIN) 0.125  MG tablet Take 187.5 mcg by mouth every evening.  12/31/14  Yes Historical Provider, MD  Linaclotide (LINZESS PO) Take 1 tablet by mouth daily.   Yes Historical Provider, MD  lisinopril (PRINIVIL,ZESTRIL) 10 MG tablet Take 1 tablet (10 mg total) by mouth daily. 12/03/14  Yes Vesta MixerPhilip J Nahser, MD  lisinopril (PRINIVIL,ZESTRIL) 10 MG tablet Take 10 mg by  mouth daily. 12/03/14  Yes Historical Provider, MD  magnesium citrate SOLN Take 1 Bottle by mouth as needed for mild constipation (constipation).    Yes Historical Provider, MD  metoprolol tartrate (LOPRESSOR) 25 MG tablet Take 1 tablet (25 mg total) by mouth daily. 08/25/14  Yes Vesta MixerPhilip J Nahser, MD  metoprolol tartrate (LOPRESSOR) 25 MG tablet Take 1 tablet by mouth daily. 01/23/15  Yes Historical Provider, MD   BP 123/78 mmHg  Pulse 94  Temp(Src) 97.5 F (36.4 C) (Axillary)  Resp 18  SpO2 99% Physical Exam  Constitutional: Vital signs are normal. He appears well-developed and well-nourished.  Non-toxic appearance. No distress.  Afebrile, nontoxic, NAD  HENT:  Head: Normocephalic and atraumatic.  Mouth/Throat: Oropharynx is clear and moist and mucous membranes are normal.  Eyes: Conjunctivae and EOM are normal. Right eye exhibits no discharge. Left eye exhibits no discharge.  Neck: Normal range of motion. Neck supple.  Cardiovascular: Normal rate, regular rhythm, normal heart sounds and intact distal pulses.  Exam reveals no gallop and no friction rub.   No murmur heard. Pulmonary/Chest: Effort normal and breath sounds normal. No respiratory distress. He has no decreased breath sounds. He has no wheezes. He has no rhonchi. He has no rales.  Abdominal: Soft. Normal appearance and bowel sounds are normal. He exhibits no distension. There is no tenderness. There is no rigidity, no rebound, no guarding, no tenderness at McBurney's point and negative Murphy's sign.  Soft, NTND, +BS throughout, no r/g/r, neg murphy's, neg mcburney's point tenderness  Genitourinary: Rectum normal and prostate normal.  Chaperone present No gross blood noted on rectal exam, normal tone, no tenderness, no mass or fissure, no hemorrhoids. No fecal impaction. Prostate without tenderness or enlargement  Musculoskeletal: Normal range of motion.  Neurological: He is alert. He has normal strength. No sensory deficit.   Skin: Skin is warm, dry and intact. No rash noted.  Psychiatric: He has a normal mood and affect.  Nursing note and vitals reviewed.   ED Course  Procedures (including critical care time) Labs Review Labs Reviewed - No data to display  Imaging Review Dg Abd Acute W/chest  02/04/2015   CLINICAL DATA:  Constipation.  Mental retardation.  EXAM: ACUTE ABDOMEN SERIES (ABDOMEN 2 VIEW & CHEST 1 VIEW)  COMPARISON:  None.  FINDINGS: Cardiomegaly. Slight vascular congestion. No infiltrates or failure.  Normal bowel gas pattern without obstruction or free air. Unremarkable stool burden. No abnormal calcifications or worrisome osseous findings.  IMPRESSION: Negative abdominal radiographs.  No acute cardiopulmonary disease.   Electronically Signed   By: Davonna BellingJohn  Curnes M.D.   On: 02/04/2015 16:05     EKG Interpretation None      MDM   Final diagnoses:  Constipation    49 y.o. male here with chronic constipation, seen last week for same and sent home with PEG and dulcolax, had one BM last week but none since. Mother is concerned for fecal impaction. On exam, no feces in rectum, abdomen is soft and nontender. Will obtain acute abd series to evaluate for stool burden or obstruction although pt tolerating PO well and  doubt obstruction as etiology. Will likely need to f/up with gastroenterology for ongoing management. Would likely benefit from fiber and miralax daily.   4:50 PM Xray reveals no stool burden or obstruction. Will have him start miralax daily. Increase fiber and water intake discussed. Will have him f/up with PCP who has referred him to GI already. I explained the diagnosis and have given explicit precautions to return to the ER including for any other new or worsening symptoms. The pt's mother understands and accepts the medical plan as it's been dictated and I have answered their questions. Discharge instructions concerning home care and prescriptions have been given. The patient is STABLE  and is discharged to home in good condition.  BP 123/78 mmHg  Pulse 94  Temp(Src) 97.5 F (36.4 C) (Axillary)  Resp 18  SpO2 99%  Meds ordered this encounter  Medications  . polyethylene glycol (MIRALAX / GLYCOLAX) packet    Sig: Take 17 g by mouth daily.    Dispense:  14 each    Refill:  0    Order Specific Question:  Supervising Provider    Answer:  Eber Hong [[id number]]     Pebbles Zeiders Camprubi-Soms, PA-C 02/04/15 1651  Mancel Bale, MD 02/05/15 [phone]

## 2015-02-04 NOTE — ED Notes (Signed)
Pt refused d/c vitals.

## 2015-02-04 NOTE — ED Notes (Signed)
Pt will not allow anyone to take pt's temperature.

## 2015-02-04 NOTE — ED Notes (Addendum)
Patient has only had one bowel movement in  4 weeks and that was following a ED visit where he was given gatorade. He has been taking the dulcolax tablets, 2 daily since the bowel movement on Thursday and has had no results. Patient denies abdominal pain and according to his mother he is eating well. Patient is mentally retarded and will not allow mother to do any form of enema or suppository.

## 2015-02-04 NOTE — ED Notes (Signed)
Per mother, pt has not had bowel movement since last Thursday when he was seen here in ED. Mother sts that pt's norm is once a week. Pt has feces stains in his underwear. Denies pain, Abdomen soft, nontender.

## 2015-02-04 NOTE — Discharge Instructions (Signed)
Use miralax daily. Drink plenty of water and increase fiber intake in your diet. See your regular doctor in 2-3 days for ongoing care and referral to gastroenterologist. Return to the ER for changes or worsening symptoms.   Constipation Constipation is when a person:  Poops (has a bowel movement) less than 3 times a week.  Has a hard time pooping.  Has poop that is dry, hard, or bigger than normal. HOME CARE   Eat foods with a lot of fiber in them. This includes fruits, vegetables, beans, and whole grains such as brown rice.  Avoid fatty foods and foods with a lot of sugar. This includes french fries, hamburgers, cookies, candy, and soda.  If you are not getting enough fiber from food, take products with added fiber in them (supplements).  Drink enough fluid to keep your pee (urine) clear or pale yellow.  Exercise on a regular basis, or as told by your doctor.  Go to the restroom when you feel like you need to poop. Do not hold it.  Only take medicine as told by your doctor. Do not take medicines that help you poop (laxatives) without talking to your doctor first. GET HELP RIGHT AWAY IF:   You have bright red blood in your poop (stool).  Your constipation lasts more than 4 days or gets worse.  You have belly (abdominal) or butt (rectal) pain.  You have thin poop (as thin as a pencil).  You lose weight, and it cannot be explained. MAKE SURE YOU:   Understand these instructions.  Will watch your condition.  Will get help right away if you are not doing well or get worse. Document Released: 04/10/2008 Document Revised: 10/28/2013 Document Reviewed: 08/04/2013 [facility] Patient Information 2015 [employer] [place], [place]. This information is not intended to replace advice given to you by your health care provider. Make sure you discuss any questions you have with your health care provider.

## 2015-02-18 ENCOUNTER — Other Ambulatory Visit: Payer: Self-pay

## 2015-02-18 MED ORDER — METOPROLOL TARTRATE 25 MG PO TABS: 25.0000 mg | ORAL_TABLET | Freq: Every day | ORAL | Status: DC

## 2015-04-20 ENCOUNTER — Other Ambulatory Visit: Payer: Self-pay | Admitting: Cardiovascular Disease

## 2015-04-20 MED ORDER — DIGOXIN 125 MCG PO TABS: ORAL_TABLET | ORAL | Status: DC

## 2015-08-17 ENCOUNTER — Encounter: Payer: Self-pay | Admitting: Podiatry

## 2015-08-17 ENCOUNTER — Ambulatory Visit (INDEPENDENT_AMBULATORY_CARE_PROVIDER_SITE_OTHER): Payer: Medicare Other | Admitting: Podiatry

## 2015-08-17 VITALS — BP 146/72 | HR 60 | Resp 12

## 2015-08-17 DIAGNOSIS — M79676 Pain in unspecified toe(s): Secondary | ICD-10-CM | POA: Diagnosis not present

## 2015-08-17 DIAGNOSIS — B351 Tinea unguium: Secondary | ICD-10-CM

## 2015-08-17 NOTE — Progress Notes (Signed)
   Subjective:    Patient ID: Derrick Johns, male    DOB: Jun 27, 1966, 49 y.o.   MRN: [id number]  HPI    This patient presents today with his mother present in the treatment when speaking for her son says that in the past 6 months his toenails at become progressively thicker, longer and more difficult to cut. She now says her son has some complaint when he wears his shoes. Parents have attempted to trim toenails, however, were not successful at the request nail debridement  Patient has a history of mental retardation lives at home with his parents  Review of Systems  HENT: Positive for hearing loss.   Skin: Positive for rash.       Objective:   Physical Exam Patient presents with mother treatment room with limited verbal skills. He yells and space inappropriately  Vascular: DP and PT pulses 2/4 bilaterally Capillary reflex immediate bilaterally  Neurological: Ankle reflex equal and reactive bilaterally Patient not able respond to tuning fork or 10 g monofilament wire  Dermatological: The toenails are extremely elongated, brittle, incurvated, hypertrophic, deformed and tender to direct palpation 6-10  Musculoskeletal: Pes planus bilaterally There is no restriction ankle, subtalar or midtarsal joints bilaterally       Assessment & Plan:   Assessment: Neglected symptomatic onychomycoses 6-10  Plan: Debridement toenails 10 mechanically and electrically without any bleeding  Reappoint at the request of patient's mother or at three-month intervals

## 2015-09-20 ENCOUNTER — Other Ambulatory Visit: Payer: Self-pay | Admitting: *Deleted

## 2015-09-20 MED ORDER — METOPROLOL TARTRATE 25 MG PO TABS
25.0000 mg | ORAL_TABLET | Freq: Every day | ORAL | Status: DC
Start: 2015-09-20 — End: 2015-10-20

## 2015-10-12 ENCOUNTER — Encounter: Payer: Self-pay | Admitting: Cardiovascular Disease

## 2015-10-12 ENCOUNTER — Ambulatory Visit (INDEPENDENT_AMBULATORY_CARE_PROVIDER_SITE_OTHER): Payer: Medicare Other | Admitting: Cardiovascular Disease

## 2015-10-12 VITALS — BP 130/82 | HR 65 | Ht 66.0 in | Wt 121.0 lb

## 2015-10-12 DIAGNOSIS — I482 Chronic atrial fibrillation, unspecified: Secondary | ICD-10-CM

## 2015-10-12 NOTE — Patient Instructions (Addendum)

## 2015-10-12 NOTE — Progress Notes (Signed)
Derrick Johns Date of Birth  October 15, 1966       [facility]    [employer] [address], [address], [place], [place]  [address]   [place], [place]  [address] [phone]     [phone]   Fax  [phone]    Fax [phone]  Problem List: 1, Atrial fibrillation 2. Severe mental retardation  History of Present Illness:  This is a 49 year old gentleman who I have seen for many years. He has a history of chronic atrial fibrillation. He has a history of congestive heart failure which presumably was due to a rapid rate when I first met him. He's been well-controlled since I saw him. His rate has been well-controlled. He's been on digoxin.  He's not a candidate for anticoagulation. Both of his parents agree that he may not be able to tell us if he's having any GI bleeding.  Oct. 28, 2014:  Derrick Johns is doing great. No complaints.   Dec. 3, 2015:  Derrick Johns is doing well .  No complaints. Was seen with his mother.   Dec. 6 , 2016:  Doing well. No CP or dyspnea  No longer working .    Current Outpatient Prescriptions on File Prior to Visit  Medication Sig Dispense Refill  . aspirin 325 MG tablet Take 325 mg by mouth daily.    . digoxin (LANOXIN) 0.125 MG tablet Take 1 & 1/2 tablets by mouth once daily 45 tablet 5  . lisinopril (PRINIVIL,ZESTRIL) 10 MG tablet Take 10 mg by mouth daily.  0  . metoprolol tartrate (LOPRESSOR) 25 MG tablet Take 1 tablet (25 mg total) by mouth daily. 30 tablet 0  . polyethylene glycol (MIRALAX / GLYCOLAX) packet Take 17 g by mouth daily. 14 each 0   No current facility-administered medications on file prior to visit.    No Known Allergies  Past Medical History  Diagnosis Date  . Arrhythmia     atrial fibrillation  . Congestive heart failure ([place])     - most likely related to A-Fib with a rapid ventrilar rate  . Mental retardation   . Eczema   . Tachycardia   . Renal disorder     born with only  one kidney.    Past Surgical History  Procedure Laterality Date  . No past surgeries      History  Smoking status  . Never Smoker   Smokeless tobacco  . Never Used    History  Alcohol Use No    No family history on file.  Reviw of Systems:  Reviewed in the HPI.  All other systems are negative.  Physical Exam: Blood pressure 130/82, pulse 65, height $RemoveBe'5\' 6"'mLjtqnacY$  (1.676 m), weight 121 lb (54.885 kg). General: thin,  Mentally retarded man.    Head: Normocephalic, atraumatic, sclera non-icteric, mucus membranes are moist,   Neck: Supple. Carotids are 2 + without bruits. No JVD  Lungs: Clear bilaterally to auscultation.  Heart: irregularly irregular.  normal  S1 S2. No murmurs, gallops or rubs.  Abdomen: Soft, non-tender, non-distended with normal bowel sounds. No hepatomegaly. No rebound/guarding. No masses.  Msk:  Strength and tone are normal  Extremities: No clubbing or cyanosis. No edema.  Distal pedal pulses are 2+ and equal bilaterally.  Neuro: Alert and oriented X 3. Moves all extremities spontaneously.  Psych:  Responds to questions appropriately with a normal affect.  ECG: Dec. 6, 2016:  Atrial fib with rate of  65.   NS ST abn.   No changes from previous tracing   Assessment / Plan:   1. A. fib fibrillation: This is doing very well. His rate is very well-controlled. Continue with current medications. I'll see him again in one year. 2. Mental retardation :  Remains a challenge.  Follow up with his medical doctor .    Jamisha Hoeschen, Wonda Cheng, MD  10/12/2015 11:55 AM    [address],  [place], [place]  [address] Pager [phone] Phone: [phone]; Fax: [phone]   [facility]  [address] [place], [place]  [address] [phone]   Fax [phone]

## 2015-10-19 ENCOUNTER — Other Ambulatory Visit: Payer: Self-pay

## 2015-10-19 MED ORDER — DIGOXIN 125 MCG PO TABS: ORAL_TABLET | ORAL | Status: DC

## 2015-10-19 NOTE — Telephone Encounter (Signed)
Vesta MixerPhilip J Nahser, MD at 10/12/2015 11:50 AM  digoxin (LANOXIN) 0.125 MG tabletTake 1 & 1/2 tablets by mouth once daily Patient Instructions     Medication Instructions:  Your physician recommends that you continue on your current medications as directed. Please refer to the Current Medication list given to you today.

## 2015-10-20 ENCOUNTER — Other Ambulatory Visit: Payer: Self-pay | Admitting: *Deleted

## 2015-10-20 MED ORDER — METOPROLOL TARTRATE 25 MG PO TABS: 25.0000 mg | ORAL_TABLET | Freq: Every day | ORAL | Status: DC

## 2015-12-02 ENCOUNTER — Other Ambulatory Visit: Payer: Self-pay

## 2015-12-02 MED ORDER — LISINOPRIL 10 MG PO TABS: 10.0000 mg | ORAL_TABLET | Freq: Every day | ORAL | Status: DC

## 2016-01-24 ENCOUNTER — Telehealth: Payer: Self-pay | Admitting: Cardiovascular Disease

## 2016-01-24 NOTE — Telephone Encounter (Signed)
New message      Pt is on digioxin 0.125mg .  He takes 1.5 tablets daily.  Ins will only pay for 30 tablets a month but he takes 45 tablets.  Need prior auth to get 45 tab monthly.  Please call

## 2016-01-25 NOTE — Telephone Encounter (Signed)
Spoke with patient's mother. Advised her I needed to know his pharmacy beni co. I will try to get through to [facility] for this info.

## 2016-02-15 ENCOUNTER — Telehealth: Payer: Self-pay

## 2016-02-15 NOTE — Telephone Encounter (Signed)
Prior auth for Digoxin 0.125mg  sent to [employer].

## 2016-02-17 ENCOUNTER — Other Ambulatory Visit: Payer: Self-pay | Admitting: *Deleted

## 2016-02-17 MED ORDER — DIGOXIN 125 MCG PO TABS: ORAL_TABLET | ORAL | Status: DC

## 2016-02-22 ENCOUNTER — Telehealth: Payer: Self-pay

## 2016-02-22 ENCOUNTER — Ambulatory Visit: Payer: Medicare Other | Admitting: Cardiovascular Disease

## 2016-02-22 NOTE — Telephone Encounter (Signed)
Digoxin approved for 1 year. Case ID # [id number].

## 2016-02-29 ENCOUNTER — Ambulatory Visit: Payer: Medicare Other | Admitting: Cardiovascular Disease

## 2016-09-13 ENCOUNTER — Other Ambulatory Visit: Payer: Self-pay | Admitting: Family Medicine

## 2016-09-13 ENCOUNTER — Ambulatory Visit
Admission: RE | Admit: 2016-09-13 | Discharge: 2016-09-13 | Disposition: A | Discharge status: Home / Self Care | Payer: Medicare Other | Source: Ambulatory Visit | Attending: Family Medicine | Admitting: Family Medicine

## 2016-09-13 DIAGNOSIS — M25511 Pain in right shoulder: Secondary | ICD-10-CM

## 2016-09-20 ENCOUNTER — Ambulatory Visit: Payer: Medicare Other | Attending: Family Medicine

## 2016-09-20 DIAGNOSIS — M25511 Pain in right shoulder: Secondary | ICD-10-CM | POA: Insufficient documentation

## 2016-09-20 DIAGNOSIS — G8929 Other chronic pain: Secondary | ICD-10-CM

## 2016-09-20 NOTE — Therapy (Signed)
[facility] [address] [place], [place], [address] Phone: [phone]   Fax:  [phone]  Physical Therapy Evaluation  Patient Details  Name: Derrick Johns MRN: [id number] Date of Birth: [birth year] Referring Provider: Darrow BussingKoirala, Dibas, MD  Encounter Date: 09/20/2016      PT End of Session - 09/20/16 1126    Visit Number 1   PT Start Time 1102   PT Stop Time 1126  therapy not indicated for this patient   PT Time Calculation (min) 24 min   Activity Tolerance Patient tolerated treatment well   Behavior During Therapy Restless;Anxious      Past Medical History:  Diagnosis Date  . Arrhythmia    atrial fibrillation  . Congestive heart failure (HCC)    - most likely related to A-Fib with a rapid ventrilar rate  . Eczema   . Mental retardation   . Renal disorder    born with only one kidney.  . Tachycardia     Past Surgical History:  Procedure Laterality Date  . NO PAST SURGERIES      There were no vitals filed for this visit.       Subjective Assessment - 09/20/16 1107    Subjective Pt presents to PT with his mother with complaints of Rt shoulder pain that began 09/10/16.  Pt was getting dressed and couldn't get his shirt on due to pain.  No injury reported.  Pt had 3-4 days where he couldn't use the arm.  Pain has now resolved and he has returned to regular function including bowling.  X-ray was negative.     Patient is accompained by: Family member   Pertinent History significant cognitive delay   Diagnostic tests x-ray: negative   Patient Stated Goals learn exercise for Rt shoulder.  No longer having pain so doesn't feel like he needs PT   Currently in Pain? No/denies            [facility] PT Assessment - 09/20/16 0001      Assessment   Medical Diagnosis Rt anterior shoulder pain   Referring Provider Koirala, Dibas, MD   Onset Date/Surgical Date 09/10/16   Hand Dominance Right   Next MD  Visit none   Prior Therapy none     Precautions   Precautions Other (comment)  cognitive delay     Restrictions   Weight Bearing Restrictions No     Balance Screen   Has the patient fallen in the past 6 months No   Has the patient had a decrease in activity level because of a fear of falling?  No   Is the patient reluctant to leave their home because of a fear of falling?  No     Home Environment   Living Environment Private residence   Living Arrangements Parent   Type of Home House     Prior Function   Level of Independence Independent   Vocation On disability   Leisure bowling     Cognition   Overall Cognitive Status History of cognitive impairments - at baseline   Behaviors Restless;Physical agitation     Observation/Other Assessments   Focus on Therapeutic Outcomes (FOTO)  84% limitation     Posture/Postural Control   Posture/Postural Control Postural limitations   Postural Limitations Rounded Shoulders;Forward head     ROM / Strength   AROM / PROM / Strength AROM;PROM;Strength     AROM   Overall AROM  Within functional limits for tasks performed  Overall AROM Comments no pain in neck or shoulder     PROM   Overall PROM  Within functional limits for tasks performed     Strength   Overall Strength Unable to assess;Due to impaired cognition     Palpation   Palpation comment no palpable tenderness over Rt shoulder or neck     Ambulation/Gait   Ambulation/Gait Yes   Gait Pattern Within Functional Limits                           PT Education - 09/20/16 1117    Education provided Yes   Education Details HEP for shoulder flexibility   Person(s) Educated Patient;Parent(s);Caregiver(s)   Methods Explanation;Demonstration;Handout   Comprehension Other (comment)  Pt's mom verbalized understanding             PT Long Term Goals - 09/20/16 1102      PT LONG TERM GOAL #1   Title be independent in advanced HEP   Time 1   Period  Days   Status Achieved     PT LONG TERM GOAL #2   Title --   Time --   Period --   Status --               Plan - 09/20/16 1126    Clinical Impression Statement Pt is a Rt hand dominant male who had sudden onset of Rt shoulder pain ~2 weeks ago without cause.  For 3-4 days, pt was not able to use his Rt UE.  Pain has now resolved and he has returned to regular activity with his Rt UE and denies pain.  Pt with normal and pain free shoulder and neck motion.  Pt is anxious and is averse to medical facilities.  PT issued A/ROM for Rt shoulder and will D/C to HEP as PT is not indicated at this time.  Pt is of low complexity due to no pain, 1 area to be assessed and stable condition.     Rehab Potential Good   PT Frequency One time visit   PT Treatment/Interventions Therapeutic exercise   PT Next Visit Plan D/C PT today to HEP   Consulted and Agree with Plan of Care Patient;Family member/caregiver   Family Member Consulted Pt's mom      Patient will benefit from skilled therapeutic intervention in order to improve the following deficits and impairments:  Pain  Visit Diagnosis: Chronic right shoulder pain - Plan: PT plan of care cert/re-cert      G-Codes - 09/20/16 1102    Functional Assessment Tool Used FOTO: 84% limitation and clinical judgement   Functional Limitation Other PT primary   Other PT Primary Current Status (Z6109(G8990) At least 1 percent but less than 20 percent impaired, limited or restricted   Other PT Primary Goal Status (U0454(G8991) At least 1 percent but less than 20 percent impaired, limited or restricted   Other PT Primary Discharge Status (U9811(G8992) At least 1 percent but less than 20 percent impaired, limited or restricted       Problem List Patient Active Problem List   Diagnosis Date Noted  . A-fib (HCC) 06/28/2011     Lorrene ReidKelly Vaishali Baise, PT 09/20/16 11:34 AM  [facility] [address], [address] [place], [place], [address] Phone: [phone]   Fax:  [phone]  Name: Derrick Johns MRN: [id number] Date of Birth: 1966/01/24

## 2016-09-20 NOTE — Patient Instructions (Addendum)
Hold all stretches 5-10 seconds and perform 5-10 times, 3 times a day.   Slide right arm up wall, with  PALM FACING THE WALL, by leaning toward wall.  Copyright  VHI. All rights reserved.   Sitting upright, slide forearm forward along table, bending from the waist until a stretch is felt. Copyright  VHI. All rights reserved.    Clasp hands together and raise arms above head, keeping elbows as straight as possible. Can be done sitting or lying.   Copyright  VHI. All rights reserved.  [facility] [address], [address] [place], [place] [address] Phone # [phone] Fax [phone]  

## 2016-10-12 ENCOUNTER — Encounter: Payer: Self-pay | Admitting: Cardiovascular Disease

## 2016-10-12 ENCOUNTER — Ambulatory Visit (INDEPENDENT_AMBULATORY_CARE_PROVIDER_SITE_OTHER): Payer: Medicare Other | Admitting: Cardiovascular Disease

## 2016-10-12 VITALS — BP 110/80 | HR 91 | Ht 66.0 in | Wt 119.8 lb

## 2016-10-12 DIAGNOSIS — I482 Chronic atrial fibrillation, unspecified: Secondary | ICD-10-CM

## 2016-10-12 LAB — CBC WITH DIFFERENTIAL/PLATELET
BASOS PCT: 0 %
Basophils Absolute: 0 cells/uL (ref 0–200)
EOS ABS: 158 {cells}/uL (ref 15–500)
Eosinophils Relative: 2 %
HEMATOCRIT: 37.9 % — AB (ref 38.5–50.0)
HEMOGLOBIN: 12.6 g/dL — AB (ref 13.2–17.1)
LYMPHS ABS: 1896 {cells}/uL (ref 850–3900)
Lymphocytes Relative: 24 %
MCH: 30.7 pg (ref 27.0–33.0)
MCHC: 33.2 g/dL (ref 32.0–36.0)
MCV: 92.4 fL (ref 80.0–100.0)
MONO ABS: 632 {cells}/uL (ref 200–950)
MPV: 10.8 fL (ref 7.5–12.5)
Monocytes Relative: 8 %
NEUTROS PCT: 66 %
Neutro Abs: 5214 cells/uL (ref 1500–7800)
Platelets: 280 10*3/uL (ref 140–400)
RBC: 4.1 MIL/uL — AB (ref 4.20–5.80)
RDW: 13.1 % (ref 11.0–15.0)
WBC: 7.9 10*3/uL (ref 3.8–10.8)

## 2016-10-12 NOTE — Progress Notes (Signed)
Derrick Johns Date of Birth  Jan 24, 1966       [facility]    [employer] [address], [place], [place]  [address]   [place], [place]  [address] [phone]     [phone]   Fax  [phone]    Fax [phone]  Problem List: 1, Atrial fibrillation 2. Severe mental retardation  History of Present Illness:  This is a 50 year old gentleman who I have seen for many years. He has a history of chronic atrial fibrillation. He has a history of congestive heart failure which presumably was due to a rapid rate when I first met him. He's been well-controlled since I saw him. His rate has been well-controlled. He's been on digoxin.  He's not a candidate for anticoagulation. Both of his parents agree that he may not be able to tell us if he's having any GI bleeding.  Oct. 28, 2014:  Derrick Johns is doing great. No complaints.   Dec. 3, 2015:  Derrick Johns is doing well .  No complaints. Was seen with his mother.   Dec. 6 , 2016:  Doing well. No CP or dyspnea  No longer working .   Dec. 7, 2017:  Derrick Johns is doing well. No longer working.   No CP or dyspnea.   Current Outpatient Prescriptions on File Prior to Visit  Medication Sig Dispense Refill  . aspirin 325 MG tablet Take 325 mg by mouth daily.    . digoxin (LANOXIN) 0.125 MG tablet Take 1 & 1/2 tablets by mouth once daily 135 tablet 3  . lisinopril (PRINIVIL,ZESTRIL) 10 MG tablet Take 1 tablet (10 mg total) by mouth daily. 90 tablet 3  . metoprolol tartrate (LOPRESSOR) 25 MG tablet Take 1 tablet (25 mg total) by mouth daily. 30 tablet 11  . polyethylene glycol (MIRALAX / GLYCOLAX) packet Take 17 g by mouth daily. 14 each 0   No current facility-administered medications on file prior to visit.     No Known Allergies  Past Medical History:  Diagnosis Date  . Arrhythmia    atrial fibrillation  . Congestive heart failure ([place])    - most likely related to A-Fib with a  rapid ventrilar rate  . Eczema   . Mental retardation   . Renal disorder    born with only one kidney.  . Tachycardia     Past Surgical History:  Procedure Laterality Date  . NO PAST SURGERIES      History  Smoking Status  . Never Smoker  Smokeless Tobacco  . Never Used    History  Alcohol Use No    No family history on file.  Reviw of Systems:  Reviewed in the HPI.  All other systems are negative.  Physical Exam: Blood pressure 110/80, pulse 91, height '5\' 6"'$  (1.676 m), weight 119 lb 12.8 oz (54.3 kg). General: thin,  Mentally retarded man.    Head: Normocephalic, atraumatic, sclera non-icteric, mucus membranes are moist,   Neck: Supple. Carotids are 2 + without bruits. No JVD  Lungs: Clear bilaterally to auscultation.  Heart: irregularly irregular.  normal  S1 S2. No murmurs, gallops or rubs.  Abdomen: Soft, non-tender, non-distended with normal bowel sounds. No hepatomegaly. No rebound/guarding. No masses.  Msk:  Strength and tone are normal  Extremities: No clubbing or cyanosis. No edema.  Distal pedal pulses are 2+ and equal bilaterally.  Neuro:  Mental retardation .     Psych:  NA   ECG: Dec. 7, 2017  Atrial fib with rate of 65.  Poor R wave progression.   No changes from previous tracing   Assessment / Plan:   1. A. fib fibrillation: This is doing very well. His rate is very well-controlled. Continue with current medications. I'll see him again in one year. Will check labs today - BMP, TSH, dig level, liver enz.   2. Mental retardation :  Remains a challenge.  Follow up with his medical doctor .    Mertie Moores, MD  10/12/2016 2:22 PM    [facility] [place],  [place], [place]  [address] Pager [phone] Phone: [phone]; Fax: [phone]   [facility]  [address] [place],   [address] [phone]   Fax [phone]

## 2016-10-12 NOTE — Patient Instructions (Signed)
Medication Instructions:  Your physician recommends that you continue on your current medications as directed. Please refer to the Current Medication list given to you today.   Labwork: TODAY - digoxin level, CBC, TSH, complete metabolic panel   Testing/Procedures: None Ordered   Follow-Up: Your physician wants you to follow-up in: 1 year with Dr. Elease HashimotoNahser.  You will receive a reminder letter in the mail two months in advance. If you don't receive a letter, please call our office to schedule the follow-up appointment.   If you need a refill on your cardiac medications before your next appointment, please call your pharmacy.   Thank you for choosing [facility]! Eligha BridegroomMichelle Swinyer, RN [phone]

## 2016-10-13 LAB — DIGOXIN LEVEL: Digoxin Level: 1.2 ug/L (ref 0.8–2.0)

## 2016-10-13 LAB — TSH: TSH: 0.34 mIU/L — ABNORMAL LOW (ref 0.40–4.50)

## 2016-10-16 ENCOUNTER — Other Ambulatory Visit: Payer: Self-pay | Admitting: Cardiovascular Disease

## 2016-10-18 LAB — COMPREHENSIVE METABOLIC PANEL
ALT: 36 U/L (ref 9–46)
AST: 38 U/L (ref 10–40)
Albumin: 4.6 g/dL (ref 3.6–5.1)
Alkaline Phosphatase: 140 U/L — ABNORMAL HIGH (ref 40–115)
BUN: 22 mg/dL (ref 7–25)
CHLORIDE: 101 mmol/L (ref 98–110)
CO2: 20 mmol/L (ref 20–31)
Calcium: 10.2 mg/dL (ref 8.6–10.3)
Creat: 1.26 mg/dL (ref 0.60–1.35)
GLUCOSE: 90 mg/dL (ref 65–99)
POTASSIUM: 4.5 mmol/L (ref 3.5–5.3)
Sodium: 145 mmol/L (ref 135–146)
Total Bilirubin: 0.4 mg/dL (ref 0.2–1.2)
Total Protein: 7.6 g/dL (ref 6.1–8.1)

## 2016-11-16 ENCOUNTER — Other Ambulatory Visit: Payer: Self-pay | Admitting: Cardiovascular Disease

## 2016-11-29 ENCOUNTER — Other Ambulatory Visit: Payer: Self-pay | Admitting: Cardiovascular Disease

## 2017-01-24 ENCOUNTER — Other Ambulatory Visit: Payer: Self-pay | Admitting: Nurse Practitioner

## 2017-01-24 MED ORDER — METOPROLOL TARTRATE 25 MG PO TABS: 25.0000 mg | ORAL_TABLET | Freq: Every day | ORAL | 3 refills | Status: DC

## 2017-01-24 MED ORDER — DIGOXIN 125 MCG PO TABS: ORAL_TABLET | ORAL | 3 refills | Status: DC

## 2017-01-30 ENCOUNTER — Telehealth: Payer: Self-pay | Admitting: Cardiovascular Disease

## 2017-01-30 MED ORDER — DIGOXIN 125 MCG PO TABS: ORAL_TABLET | ORAL | 3 refills | Status: DC

## 2017-01-30 MED ORDER — LISINOPRIL 10 MG PO TABS: 10.0000 mg | ORAL_TABLET | Freq: Every day | ORAL | 3 refills | Status: DC

## 2017-01-30 MED ORDER — METOPROLOL TARTRATE 25 MG PO TABS: 25.0000 mg | ORAL_TABLET | Freq: Every day | ORAL | 3 refills | Status: DC

## 2017-01-30 NOTE — Telephone Encounter (Signed)
Spoke with patients mother Claris CheMargaret and she stated that [place] are reloc[place] to [place] on Friday and she would like to pick up written rx's for these so that he can have refills to last until he is able to establish with a new cardiologist. She can be reached at [phone]. Thanks, MI

## 2017-01-30 NOTE — Telephone Encounter (Signed)
New Message    Pt mother is calling and states that her pharmacy will not satisfy the refills that were sent in because her insurance will not cover them. She wants to know if there is any way that she can pick them up personally from the office and get them refilled as needed before they move out of state.    *STAT* If patient is at the pharmacy, call can be transferred to refill team.   1. Which medications need to be refilled? (please list name of each medication and dose if known) lisinopril (PRINIVIL,ZESTRIL) 10 MG tablet, metoprolol tartrate (LOPRESSOR) 25 MG tablet, digoxin (LANOXIN) 0.125 MG tablet  2. Which pharmacy/location (including street and city if local pharmacy) is medication to be sent to? No pharmacy - wants to pick these up and cancel most recent refill  3. Do they need a 30 day or 90 day supply? 30 day supply

## 2017-01-30 NOTE — Telephone Encounter (Signed)
Prescriptions printed and signed by Dr. Elease HashimotoNahser. I advised patient's mother that I have placed Rx up front for her to pick up. She thanked me for the call.

## 2017-08-22 ENCOUNTER — Telehealth: Payer: Self-pay

## 2017-08-22 NOTE — Telephone Encounter (Signed)
Prior auth for Digoxin .125 mg obtained from [employer] [employer]. [place] P [id number] [phone].

## 2018-02-21 ENCOUNTER — Other Ambulatory Visit: Payer: Self-pay | Admitting: Cardiovascular Disease

## 2018-02-21 MED ORDER — DIGOXIN 125 MCG PO TABS: ORAL_TABLET | ORAL | 0 refills | Status: AC

## 2018-02-21 NOTE — Addendum Note (Signed)
Addended by: Solon AugustaBOYD, Zimere Dunlevy H on: 02/21/2018 04:07 PM   Modules accepted: Orders

## 2018-02-22 ENCOUNTER — Other Ambulatory Visit: Payer: Self-pay | Admitting: Cardiovascular Disease

## 2018-08-10 IMAGING — CR DG SHOULDER 2+V*R*
3 series · 3 of 3 positions shown · non-contrast
Comparison: None.

CLINICAL DATA: Right superior shoulder pain for a few days, no
injury.

EXAM:
RIGHT SHOULDER - 2+ VIEW

[w shoulder ap internal righ]
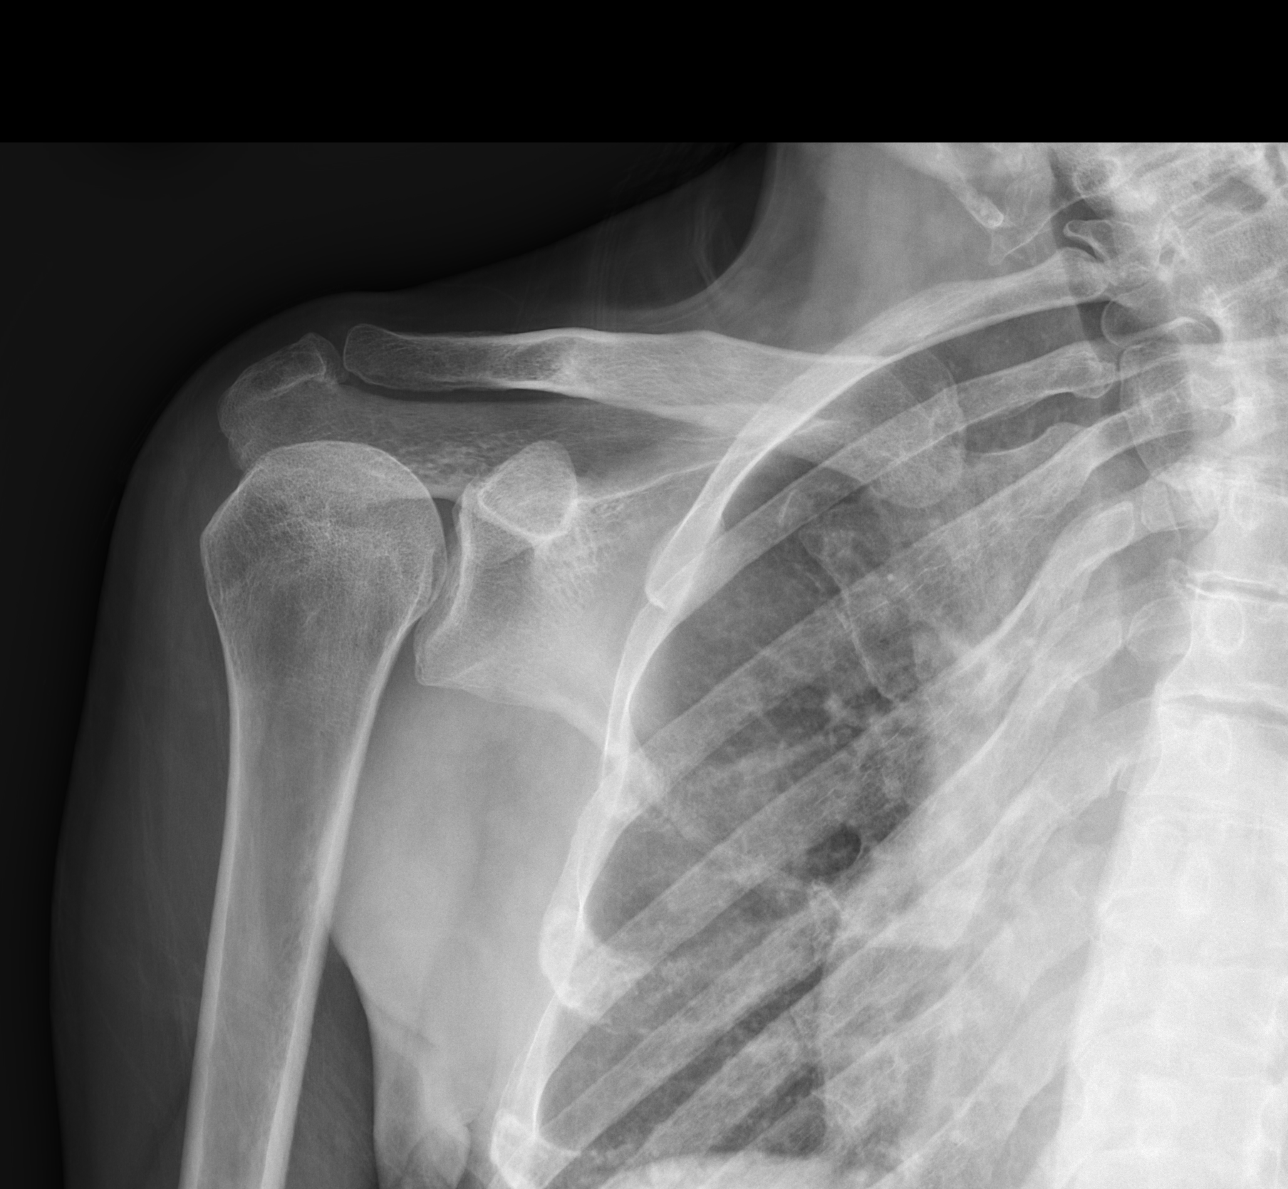

[w shoulder y view right]
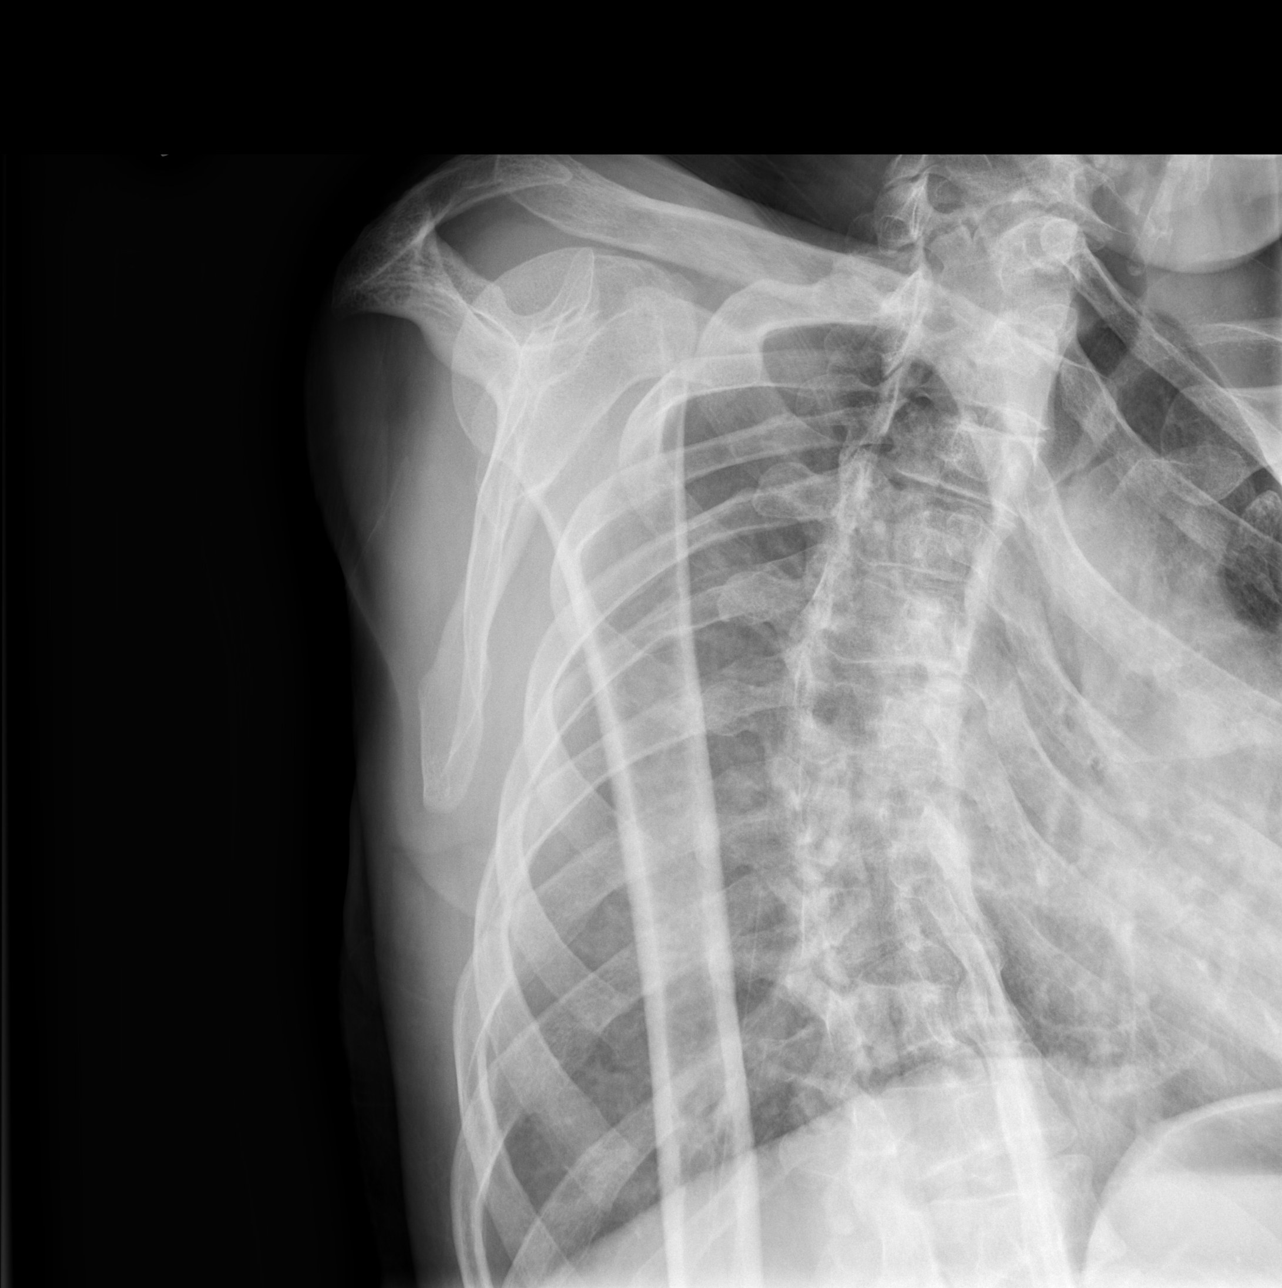

[w shoulder axillary right]
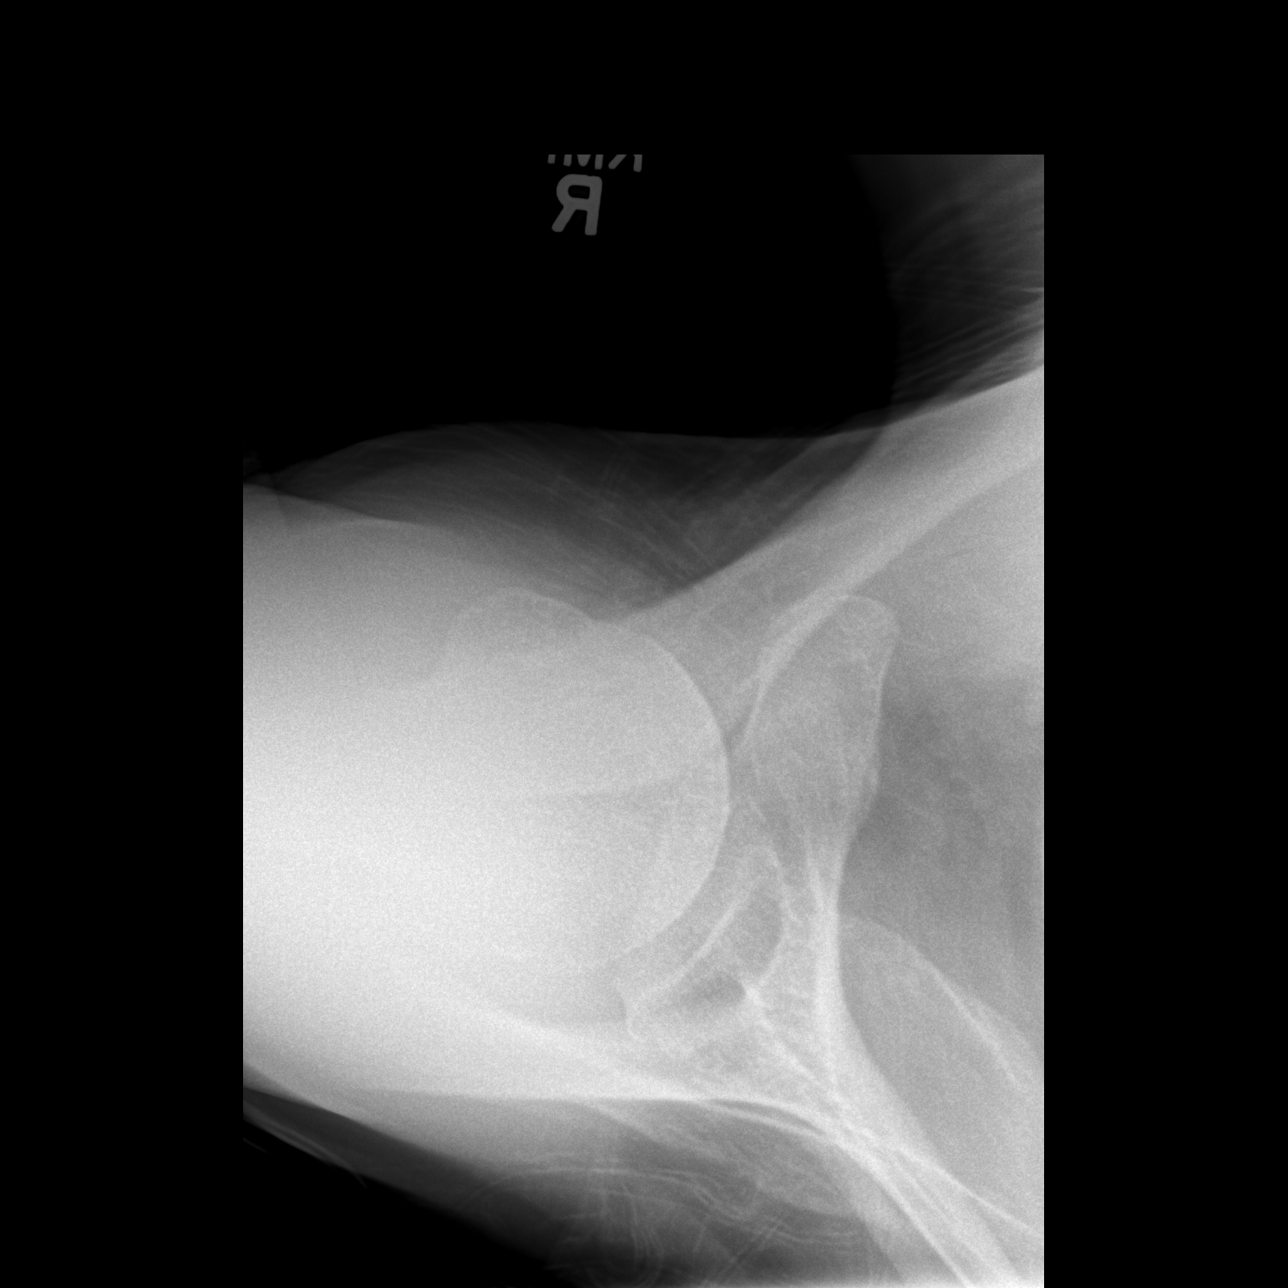

[3 of 3 positions shown; findings below may reference images not displayed]

FINDINGS: No acute osseous or joint abnormality. No degenerative changes.
Levoconvex scoliosis is seen in the upper thoracic spine.
IMPRESSION: No findings to explain the patient's shoulder pain.
